# Patient Record
Sex: Female | Born: 1960 | Race: White | Hispanic: No | Marital: Married | Smoking: Current every day smoker
Health system: Southern US, Community
[De-identification: ages and names within clinical notes are randomized; demographics above are authoritative.]

## PROBLEM LIST (undated history)

## (undated) DIAGNOSIS — R51 Headache: Secondary | ICD-10-CM

## (undated) DIAGNOSIS — R519 Headache, unspecified: Secondary | ICD-10-CM

## (undated) DIAGNOSIS — G629 Polyneuropathy, unspecified: Secondary | ICD-10-CM

## (undated) DIAGNOSIS — E119 Type 2 diabetes mellitus without complications: Secondary | ICD-10-CM

## (undated) DIAGNOSIS — F329 Major depressive disorder, single episode, unspecified: Secondary | ICD-10-CM

## (undated) DIAGNOSIS — F419 Anxiety disorder, unspecified: Secondary | ICD-10-CM

## (undated) DIAGNOSIS — I10 Essential (primary) hypertension: Secondary | ICD-10-CM

## (undated) DIAGNOSIS — F32A Depression, unspecified: Secondary | ICD-10-CM

## (undated) HISTORY — PX: CHOLECYSTECTOMY: SHX55

## (undated) HISTORY — PX: BACK SURGERY: SHX140

## (undated) HISTORY — PX: ABDOMINAL HYSTERECTOMY: SHX81

## (undated) HISTORY — PX: NECK SURGERY: SHX720

## (undated) HISTORY — PX: BLADDER SURGERY: SHX569

---

## 2007-07-18 ENCOUNTER — Emergency Department: Payer: Self-pay | Admitting: Emergency Medicine

## 2008-09-25 ENCOUNTER — Encounter: Admission: RE | Admit: 2008-09-25 | Discharge: 2008-09-25 | Payer: Self-pay | Admitting: Neurosurgery

## 2008-10-01 ENCOUNTER — Observation Stay (HOSPITAL_COMMUNITY): Admission: AD | Admit: 2008-10-01 | Discharge: 2008-10-02 | Payer: Self-pay | Admitting: Neurosurgery

## 2009-01-20 ENCOUNTER — Inpatient Hospital Stay (HOSPITAL_COMMUNITY): Admission: RE | Admit: 2009-01-20 | Discharge: 2009-01-24 | Payer: Self-pay | Admitting: Neurosurgery

## 2009-04-14 ENCOUNTER — Emergency Department: Payer: Self-pay | Admitting: Emergency Medicine

## 2009-08-13 ENCOUNTER — Encounter: Admission: RE | Admit: 2009-08-13 | Discharge: 2009-08-13 | Payer: Self-pay | Admitting: Neurosurgery

## 2009-09-23 ENCOUNTER — Encounter: Admission: RE | Admit: 2009-09-23 | Discharge: 2009-09-23 | Payer: Self-pay | Admitting: Neurosurgery

## 2010-06-16 LAB — BASIC METABOLIC PANEL
CO2: 26 mEq/L (ref 19–32)
Calcium: 10.1 mg/dL (ref 8.4–10.5)
Creatinine, Ser: 0.96 mg/dL (ref 0.4–1.2)
GFR calc Af Amer: >60 mL/min (ref >60)
GFR calc non Af Amer: >60 mL/min (ref >60)
Glucose, Bld: 94 mg/dL (ref 70–99)
Potassium: 4.3 mEq/L (ref 3.5–5.1)
Sodium: 141 mEq/L (ref 135–145)

## 2010-06-16 LAB — CBC
Hemoglobin: 10.7 g/dL — ABNORMAL LOW (ref 12.0–15.0)
RDW: 13.5 % (ref 11.5–15.5)
WBC: 12 10*3/uL — ABNORMAL HIGH (ref 4.0–10.5)

## 2010-06-16 LAB — TYPE AND SCREEN
ABO/RH(D): B POS
Antibody Screen: NEGATIVE

## 2010-06-16 LAB — ABO/RH: ABO/RH(D): B POS

## 2010-06-20 LAB — CBC
Hemoglobin: 15.1 g/dL — ABNORMAL HIGH (ref 12.0–15.0)
MCV: 92.4 fL (ref 78.0–100.0)
Platelets: 335 10*3/uL (ref 150–400)
RBC: 4.75 MIL/uL (ref 3.87–5.11)

## 2010-06-20 LAB — BASIC METABOLIC PANEL
CO2: 25 mEq/L (ref 19–32)
Calcium: 10.1 mg/dL (ref 8.4–10.5)
GFR calc non Af Amer: >60 mL/min (ref >60)
Glucose, Bld: 106 mg/dL — ABNORMAL HIGH (ref 70–99)
Potassium: 4.1 mEq/L (ref 3.5–5.1)
Sodium: 141 mEq/L (ref 135–145)

## 2010-07-27 NOTE — H&P (Signed)
Sandra Stein, Sandra Stein                ACCOUNT NO.:  [id number]   MEDICAL RECORD NO.:  [id number]          PATIENT TYPE:  INP   LOCATION:  3008                         FACILITY:  [facility]   PHYSICIAN:  Hilda Lias, M.D.   DATE OF BIRTH:  1961-03-06   DATE OF ADMISSION:  10/01/2008  DATE OF DISCHARGE:                              HISTORY & PHYSICAL   Sandra Stein is a lady who was seen by me about 2 months ago in my office  complaining of neck pain radiating to the right upper extremity, which  had been going on for several months.  The patient has failed  conservative treatment.  She is also complaining of back pain radiates  into both legs.  Because of these findings, she decided pain was not any  better.  She wanted to try with decompression of the cervical area first  and later on take care of the lumbar area.   ALLERGIES:  She is allergic to IBUPROFEN and AXERT.   SOCIAL HISTORY:  The patient does not drink.  She smokes 4 cigarettes a  day.   FAMILY HISTORY:  Positive for high blood pressure and heart disease.   REVIEW OF SYSTEMS:  Positive anxiety.  The patient admits to back pain,  neck pain.   PHYSICAL EXAMINATION:  HEAD, EYES, EARS, NOSE, AND THROAT:  Normal.  NECK:  She has a decreased flexibility secondary to pain.  LUNGS:  There are some rhonchi bilaterally.  CARDIOVASCULAR:  Normal.  ABDOMEN:  Normal.  EXTREMITIES:  Normal pulses.  NEURO:  She has weakness of the right biceps.  Sensation is normal.  Reflex is 1+.  She has __________ flexibility of the lumbar spine.   X-rays showed that she has cervical stenosis and probably herniated disk  at L5-6.  She has some mild arthrosis at the level of 4-5 and 3-4.  A  lumbar spine x-ray showed degenerative disk disease at L5-S1.   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   RECOMMENDATIONS:  The patient will be admitted for surgery.  The  procedure will be anterior cervical  diskectomy at the L5-6.  The patient  knows about the risks of infection, CSF leak, worsening pain, need for  surgery, stroke.  She also is fully aware that this type of surgery will  not correct lumbar problems.           ______________________________  Hilda Lias, M.D.     EB/MEDQ  D:  10/01/2008  T:  10/02/2008  Job:  [id number]

## 2010-07-27 NOTE — Op Note (Signed)
Sandra Stein, Sandra Stein                ACCOUNT NO.:  [id number]   MEDICAL RECORD NO.:  [id number]          PATIENT TYPE:  INP   LOCATION:  3008                         FACILITY:  [facility]   PHYSICIAN:  Hilda Lias, M.D.   DATE OF BIRTH:  [birth year]   DATE OF PROCEDURE:  10/01/2008  DATE OF DISCHARGE:                               OPERATIVE REPORT   PREOPERATIVE DIAGNOSES:  C5-C6 herniated disk, spondylosis, right C6  radiculopathy, obesity.   POSTOPERATIVE DIAGNOSES:  C5-C6 herniated disk, spondylosis, right C6  radiculopathy, obesity.   PROCEDURE:  Anterior 5-6 diskectomy, decompression of spinal cord,  decompression of both C6 nerve roots, interbody fusion with  auto/allograft, plate microscope.   SURGEON:  Hilda Lias, MD   CLINICAL HISTORY:  [name] is a 50 year old female complaining of  neck pain that radiates to the right upper extremity.  She has failed  conservative treatment.  X-rays showed that she has a herniated disk and  also spondylosis at the level of 5-6 to the right.  The patient wants to  proceed with surgery.  The risks were explained to her.   PROCEDURE:  The patient was taken to the OR and after intubation the  left side of the neck was cleaned with DuraPrep.  The patient has a  short neck.  It was difficult to feel the trachea secondary to her  obesity.  Nevertheless, incision was made in the left side through the  skin and subcutaneous tissue down to the cervical area.  We were able to  dissect and the x-ray showed that indeed we were at the level of C4-C5.  From then, we __________ straight down and we brought the microscope  into the area.  We opened the anterior ligament at the level of 5-6 and  total gross diskectomy was achieved.  The posterior ligament was opened  and we started working in the midline to the right side.  With  __________ , we were able to work in the right side.  At the level of  C6, the nerve was quite narrow.  The foramen was  quite stenotic and  there was some 3-4 fragmental disk compromising in that area.  From then  on after we decompressed the C6 nerve root, we decompressed the one in  the left side.  The central canal at the end was opened.  Then the  endplate was drilled and allograft of 7 mm with autograft inside was  inserted, also BMX.  A plate using four screw was achieved.  We tried to  do the x-ray again secondary to big shoulder and short neck, we were  unable to see anything __________ shoulder we were able to see them, we  were at the level of 5-6.  Although we achieved good hemostasis, I left  a drain.  The wound was closed with Vicryl and Steri-Strips.           ______________________________  Hilda Lias, M.D.     EB/MEDQ  D:  10/01/2008  T:  10/02/2008  Job:  [id number]

## 2016-07-26 ENCOUNTER — Other Ambulatory Visit: Payer: Self-pay | Admitting: Neurological Surgery

## 2016-08-11 ENCOUNTER — Inpatient Hospital Stay (HOSPITAL_COMMUNITY)
Admission: RE | Admit: 2016-08-11 | Discharge: 2016-08-11 | Disposition: A | Discharge status: Home / Self Care | Payer: Self-pay | Source: Ambulatory Visit

## 2016-08-11 NOTE — Pre-Procedure Instructions (Signed)
Sandra Stein  08/11/2016     No Pharmacies Listed   Your procedure is scheduled on June 7.  Report to [facility] Admitting at 915 A.M.  Call this number if you have problems the morning of surgery:  [phone]   Remember:  Do not eat food or drink liquids after midnight.  Take these medicines the morning of surgery with A SIP OF WATER  Stop taking aspirin, BC's, Goody's, Herbal medications, Fish Oil, Ibuprofen, Advil, Motrin, Aleve, vitamins - 7 days before your surgery   Do not wear jewelry, make-up or nail polish.  Do not wear lotions, powders, or perfumes, or deoderant.  Do not shave 48 hours prior to surgery.  Men may shave face and neck.  Do not bring valuables to the hospital.  [facility] is not responsible for any belongings or valuables.  Contacts, dentures or bridgework may not be worn into surgery.  Leave your suitcase in the car.  After surgery it may be brought to your room.  For patients admitted to the hospital, discharge time will be determined by your treatment team.  Patients discharged the day of surgery will not be allowed to drive home.    Special instructions:  [place] - Preparing for Surgery  Before surgery, you can play an important role.  Because skin is not sterile, your skin needs to be as free of germs as possible.  You can reduce the number of germs on you skin by washing with CHG (chlorahexidine gluconate) soap before surgery.  CHG is an antiseptic cleaner which kills germs and bonds with the skin to continue killing germs even after washing.  Please DO NOT use if you have an allergy to CHG or antibacterial soaps.  If your skin becomes reddened/irritated stop using the CHG and inform your nurse when you arrive at Short Stay.  Do not shave (including legs and underarms) for at least 48 hours prior to the first CHG shower.  You may shave your face.  Please follow these instructions carefully:   1.  Shower with CHG Soap the  night before surgery and the                                morning of Surgery.  2.  If you choose to wash your hair, wash your hair first as usual with your       normal shampoo.  3.  After you shampoo, rinse your hair and body thoroughly to remove the                      Shampoo.  4.  Use CHG as you would any other liquid soap.  You can apply chg directly       to the skin and wash gently with scrungie or a clean washcloth.  5.  Apply the CHG Soap to your body ONLY FROM THE NECK DOWN.        Do not use on open wounds or open sores.  Avoid contact with your eyes,       ears, mouth and genitals (private parts).  Wash genitals (private parts)       with your normal soap.  6.  Wash thoroughly, paying special attention to the area where your surgery        will be performed.  7.  Thoroughly rinse your body with warm water from  the neck down.  8.  DO NOT shower/wash with your normal soap after using and rinsing off       the CHG Soap.  9.  Pat yourself dry with a clean towel.            10.  Wear clean pajamas.            11.  Place clean sheets on your bed the night of your first shower and do not        sleep with pets.  Day of Surgery  Do not apply any lotions/deoderants the morning of surgery.  Please wear clean clothes to the hospital/surgery center.     Please read over the following fact sheets that you were given. Pain Booklet, Coughing and Deep Breathing, MRSA Information and Surgical Site Infection Prevention

## 2016-08-11 NOTE — Progress Notes (Signed)
Not here for PAT appointment message left to return call.

## 2016-08-16 ENCOUNTER — Ambulatory Visit (HOSPITAL_COMMUNITY)
Admission: RE | Admit: 2016-08-16 | Discharge: 2016-08-16 | Disposition: A | Discharge status: Home / Self Care | Payer: Medicaid Other | Source: Ambulatory Visit | Attending: Neurological Surgery | Admitting: Neurological Surgery

## 2016-08-16 ENCOUNTER — Encounter (HOSPITAL_COMMUNITY): Payer: Self-pay

## 2016-08-16 ENCOUNTER — Encounter (HOSPITAL_COMMUNITY)
Admission: RE | Admit: 2016-08-16 | Discharge: 2016-08-16 | Disposition: A | Discharge status: Home / Self Care | Payer: Medicaid Other | Source: Ambulatory Visit | Attending: Neurological Surgery | Admitting: Neurological Surgery

## 2016-08-16 DIAGNOSIS — Z01818 Encounter for other preprocedural examination: Secondary | ICD-10-CM | POA: Diagnosis not present

## 2016-08-16 DIAGNOSIS — M542 Cervicalgia: Secondary | ICD-10-CM | POA: Diagnosis not present

## 2016-08-16 DIAGNOSIS — Z01810 Encounter for preprocedural cardiovascular examination: Secondary | ICD-10-CM | POA: Insufficient documentation

## 2016-08-16 HISTORY — DX: Major depressive disorder, single episode, unspecified: F32.9

## 2016-08-16 HISTORY — DX: Polyneuropathy, unspecified: G62.9

## 2016-08-16 HISTORY — DX: Headache, unspecified: R51.9

## 2016-08-16 HISTORY — DX: Depression, unspecified: F32.A

## 2016-08-16 HISTORY — DX: Anxiety disorder, unspecified: F41.9

## 2016-08-16 HISTORY — DX: Type 2 diabetes mellitus without complications: E11.9

## 2016-08-16 HISTORY — DX: Headache: R51

## 2016-08-16 HISTORY — DX: Essential (primary) hypertension: I10

## 2016-08-16 LAB — BASIC METABOLIC PANEL
ANION GAP: 10 (ref 5–15)
BUN: 15 mg/dL (ref 6–20)
CO2: 23 mmol/L (ref 22–32)
Calcium: 9.6 mg/dL (ref 8.9–10.3)
Chloride: 105 mmol/L (ref 101–111)
Creatinine, Ser: 0.91 mg/dL (ref 0.44–1.00)
GFR calc non Af Amer: >60 mL/min (ref >60)
Glucose, Bld: 69 mg/dL (ref 65–99)
POTASSIUM: 4 mmol/L (ref 3.5–5.1)
Sodium: 138 mmol/L (ref 135–145)

## 2016-08-16 LAB — PROTIME-INR
INR: 1.17
Prothrombin Time: 15 seconds (ref 11.4–15.2)

## 2016-08-16 LAB — GLUCOSE, CAPILLARY: GLUCOSE-CAPILLARY: 162 mg/dL — AB (ref 65–99)

## 2016-08-16 LAB — CBC WITH DIFFERENTIAL/PLATELET
BASOS ABS: 0 10*3/uL (ref 0.0–0.1)
BASOS PCT: 0 %
Eosinophils Absolute: 0.3 10*3/uL (ref 0.0–0.7)
Eosinophils Relative: 4 %
HEMATOCRIT: 41.5 % (ref 36.0–46.0)
HEMOGLOBIN: 13.5 g/dL (ref 12.0–15.0)
LYMPHS PCT: 34 %
Lymphs Abs: 2.7 10*3/uL (ref 0.7–4.0)
MCH: 30.1 pg (ref 26.0–34.0)
MCHC: 32.5 g/dL (ref 30.0–36.0)
MCV: 92.4 fL (ref 78.0–100.0)
MONO ABS: 0.4 10*3/uL (ref 0.1–1.0)
MONOS PCT: 5 %
NEUTROS ABS: 4.5 10*3/uL (ref 1.7–7.7)
NEUTROS PCT: 57 %
Platelets: 160 10*3/uL (ref 150–400)
RBC: 4.49 MIL/uL (ref 3.87–5.11)
RDW: 13.9 % (ref 11.5–15.5)
WBC: 7.8 10*3/uL (ref 4.0–10.5)

## 2016-08-16 LAB — SURGICAL PCR SCREEN
MRSA, PCR: NEGATIVE
Staphylococcus aureus: NEGATIVE

## 2016-08-16 NOTE — Progress Notes (Signed)
[facility]

## 2016-08-16 NOTE — Pre-Procedure Instructions (Signed)
Sandra Stein  08/16/2016      [facility] - [place], [place] - [address] [place] [address] Phone: [phone] Fax: [phone]    Your procedure is scheduled on Thursday August 18, 2016.  Report to [facility] Admitting at 915 A.M.  Call this number if you have problems the morning of surgery:  [phone]   Remember:  Do not eat food or drink liquids after midnight.  Take these medicines the morning of surgery with A SIP OF WATER: Albuterol inhaler (if needed), Citalopram (Celexa), Diazepam (Valium) - if needed,   Stop taking aspirin, BC's, Goody's, Herbal medications, Fish Oil, Ibuprofen, Advil, Motrin, Aleve, vitamins - 7 days before your surgery     How to Manage Your Diabetes Before and After Surgery  Why is it important to control my blood sugar before and after surgery? . Improving blood sugar levels before and after surgery helps healing and can limit problems. . A way of improving blood sugar control is eating a healthy diet by: o  Eating less sugar and carbohydrates o  Increasing activity/exercise o  Talking with your doctor about reaching your blood sugar goals . High blood sugars (greater than 180 mg/dL) can raise your risk of infections and slow your recovery, so you will need to focus on controlling your diabetes during the weeks before surgery. . Make sure that the doctor who takes care of your diabetes knows about your planned surgery including the date and location.  How do I manage my blood sugar before surgery? . Check your blood sugar at least 4 times a day, starting 2 days before surgery, to make sure that the level is not too high or low. o Check your blood sugar the morning of your surgery when you wake up and every 2 hours until you get to the Short Stay unit. . If your blood sugar is less than 70 mg/dL, you will need to treat for low blood sugar: o Do not take insulin. o Treat a low blood sugar  (less than 70 mg/dL) with  cup of clear juice (cranberry or apple), 4 glucose tablets, OR glucose gel. o Recheck blood sugar in 15 minutes after treatment (to make sure it is greater than 70 mg/dL). If your blood sugar is not greater than 70 mg/dL on recheck, call [phone] for further instructions. . Report your blood sugar to the short stay nurse when you get to Short Stay.  . If you are admitted to the hospital after surgery: o Your blood sugar will be checked by the staff and you will probably be given insulin after surgery (instead of oral diabetes medicines) to make sure you have good blood sugar levels. o The goal for blood sugar control after surgery is 80-180 mg/dL.       WHAT DO I DO ABOUT MY DIABETES MEDICATION?   [facility] Kitchen Do not take oral diabetes medicines (pills) the morning of surgery.  . THE NIGHT BEFORE SURGERY, take 28-31 units of Novolog 70/30 insulin.       . THE MORNING OF SURGERY, take 0 units of insulin.  . If your CBG is greater than 220 mg/dL, you may take  of your sliding scale (correction) dose of insulin.  Other Instructions:          Do not wear jewelry, make-up or nail polish.  Do not wear lotions, powders, or perfumes, or deoderant.  Do not  shave 48 hours prior to surgery.  Men may shave face and neck.  Do not bring valuables to the hospital.  [facility] is not responsible for any belongings or valuables.  Contacts, dentures or bridgework may not be worn into surgery.  Leave your suitcase in the car.  After surgery it may be brought to your room.  For patients admitted to the hospital, discharge time will be determined by your treatment team.  Patients discharged the day of surgery will not be allowed to drive home.    Special instructions:    [facility]- Preparing For Surgery  Before surgery, you can play an important role. Because skin is not sterile, your skin needs to be as free of germs as possible. You can reduce the number of germs  on your skin by washing with CHG (chlorahexidine gluconate) Soap before surgery.  CHG is an antiseptic cleaner which kills germs and bonds with the skin to continue killing germs even after washing.  Please do not use if you have an allergy to CHG or antibacterial soaps. If your skin becomes reddened/irritated stop using the CHG.  Do not shave (including legs and underarms) for at least 48 hours prior to first CHG shower. It is OK to shave your face.  Please follow these instructions carefully.   1. Shower the NIGHT BEFORE SURGERY and the MORNING OF SURGERY with CHG.   2. If you chose to wash your hair, wash your hair first as usual with your normal shampoo.  3. After you shampoo, rinse your hair and body thoroughly to remove the shampoo.  4. Use CHG as you would any other liquid soap. You can apply CHG directly to the skin and wash gently with a scrungie or a clean washcloth.   5. Apply the CHG Soap to your body ONLY FROM THE NECK DOWN.  Do not use on open wounds or open sores. Avoid contact with your eyes, ears, mouth and genitals (private parts). Wash genitals (private parts) with your normal soap.  6. Wash thoroughly, paying special attention to the area where your surgery will be performed.  7. Thoroughly rinse your body with warm water from the neck down.  8. DO NOT shower/wash with your normal soap after using and rinsing off the CHG Soap.  9. Pat yourself dry with a CLEAN TOWEL.   10. Wear CLEAN PAJAMAS   11. Place CLEAN SHEETS on your bed the night of your first shower and DO NOT SLEEP WITH PETS.    Day of Surgery: Do not apply any deodorants/lotions. Please wear clean clothes to the hospital/surgery center.        Please read over the following fact sheets that you were given. Pain Booklet, Coughing and Deep Breathing, MRSA Information and Surgical Site Infection Prevention

## 2016-08-17 LAB — HEMOGLOBIN A1C
Hgb A1c MFr Bld: 6.3 % — ABNORMAL HIGH (ref 4.8–5.6)
Mean Plasma Glucose: 134 mg/dL

## 2016-08-17 MED ORDER — DEXTROSE 5 % IV SOLN
3.0000 g | INTRAVENOUS | Status: DC
  Filled 2016-08-17: qty 3000

## 2016-08-17 MED ORDER — DEXAMETHASONE SODIUM PHOSPHATE 10 MG/ML IJ SOLN
10.0000 mg | INTRAMUSCULAR | Status: AC
  Administered 2016-08-18: 10 mg via INTRAVENOUS
  Filled 2016-08-17: qty 1

## 2016-08-18 ENCOUNTER — Ambulatory Visit (HOSPITAL_COMMUNITY): Payer: Medicaid Other | Admitting: Certified Registered Nurse Anesthetist

## 2016-08-18 ENCOUNTER — Ambulatory Visit (HOSPITAL_COMMUNITY): Payer: Medicaid Other

## 2016-08-18 ENCOUNTER — Observation Stay (HOSPITAL_COMMUNITY)
Admission: RE | Admit: 2016-08-18 | Discharge: 2016-08-19 | Disposition: A | Discharge status: Home / Self Care | Payer: Medicaid Other | Source: Ambulatory Visit | Attending: Neurological Surgery | Admitting: Neurological Surgery

## 2016-08-18 ENCOUNTER — Encounter (HOSPITAL_COMMUNITY): Payer: Self-pay | Admitting: *Deleted

## 2016-08-18 ENCOUNTER — Encounter (HOSPITAL_COMMUNITY)
Admission: RE | Disposition: A | Discharge status: Home / Self Care | Payer: Self-pay | Source: Ambulatory Visit | Attending: Neurological Surgery

## 2016-08-18 DIAGNOSIS — M5021 Other cervical disc displacement,  high cervical region: Secondary | ICD-10-CM | POA: Insufficient documentation

## 2016-08-18 DIAGNOSIS — F329 Major depressive disorder, single episode, unspecified: Secondary | ICD-10-CM | POA: Insufficient documentation

## 2016-08-18 DIAGNOSIS — M4802 Spinal stenosis, cervical region: Secondary | ICD-10-CM | POA: Diagnosis not present

## 2016-08-18 DIAGNOSIS — I10 Essential (primary) hypertension: Secondary | ICD-10-CM | POA: Insufficient documentation

## 2016-08-18 DIAGNOSIS — F419 Anxiety disorder, unspecified: Secondary | ICD-10-CM | POA: Diagnosis not present

## 2016-08-18 DIAGNOSIS — Z794 Long term (current) use of insulin: Secondary | ICD-10-CM | POA: Insufficient documentation

## 2016-08-18 DIAGNOSIS — Z419 Encounter for procedure for purposes other than remedying health state, unspecified: Secondary | ICD-10-CM

## 2016-08-18 DIAGNOSIS — E119 Type 2 diabetes mellitus without complications: Secondary | ICD-10-CM | POA: Insufficient documentation

## 2016-08-18 DIAGNOSIS — F17210 Nicotine dependence, cigarettes, uncomplicated: Secondary | ICD-10-CM | POA: Diagnosis not present

## 2016-08-18 DIAGNOSIS — M47812 Spondylosis without myelopathy or radiculopathy, cervical region: Secondary | ICD-10-CM | POA: Diagnosis not present

## 2016-08-18 DIAGNOSIS — Z981 Arthrodesis status: Secondary | ICD-10-CM | POA: Diagnosis not present

## 2016-08-18 DIAGNOSIS — Z79899 Other long term (current) drug therapy: Secondary | ICD-10-CM | POA: Insufficient documentation

## 2016-08-18 DIAGNOSIS — Z7984 Long term (current) use of oral hypoglycemic drugs: Secondary | ICD-10-CM | POA: Insufficient documentation

## 2016-08-18 DIAGNOSIS — M542 Cervicalgia: Secondary | ICD-10-CM | POA: Diagnosis present

## 2016-08-18 DIAGNOSIS — M4322 Fusion of spine, cervical region: Secondary | ICD-10-CM | POA: Diagnosis present

## 2016-08-18 HISTORY — PX: ANTERIOR CERVICAL DECOMP/DISCECTOMY FUSION: SHX1161

## 2016-08-18 LAB — GLUCOSE, CAPILLARY
GLUCOSE-CAPILLARY: 150 mg/dL — AB (ref 65–99)
GLUCOSE-CAPILLARY: 272 mg/dL — AB (ref 65–99)
GLUCOSE-CAPILLARY: 241 mg/dL — AB (ref 65–99)
Glucose-Capillary: 135 mg/dL — ABNORMAL HIGH (ref 65–99)
Glucose-Capillary: 96 mg/dL (ref 65–99)

## 2016-08-18 SURGERY — ANTERIOR CERVICAL DECOMPRESSION/DISCECTOMY FUSION 2 LEVEL/HARDWARE REMOVAL
Anesthesia: General | Laterality: Left

## 2016-08-18 MED ORDER — ACETAMINOPHEN 325 MG PO TABS
650.0000 mg | ORAL_TABLET | ORAL | Status: DC | PRN
  Administered 2016-08-19: 650 mg via ORAL
  Filled 2016-08-18: qty 2

## 2016-08-18 MED ORDER — LACTATED RINGERS IV SOLN
INTRAVENOUS | Status: DC
  Administered 2016-08-18 (×3): via INTRAVENOUS

## 2016-08-18 MED ORDER — NORTRIPTYLINE HCL 25 MG PO CAPS
50.0000 mg | ORAL_CAPSULE | Freq: Every day | ORAL | Status: DC
  Administered 2016-08-18: 50 mg via ORAL
  Filled 2016-08-18: qty 2

## 2016-08-18 MED ORDER — FUROSEMIDE 20 MG PO TABS
20.0000 mg | ORAL_TABLET | Freq: Every day | ORAL | Status: DC
  Administered 2016-08-19: 20 mg via ORAL
  Filled 2016-08-18: qty 1

## 2016-08-18 MED ORDER — LIDOCAINE 2% (20 MG/ML) 5 ML SYRINGE
INTRAMUSCULAR | Status: DC | PRN
  Administered 2016-08-18: 100 mg via INTRAVENOUS

## 2016-08-18 MED ORDER — MIDAZOLAM HCL 2 MG/2ML IJ SOLN
INTRAMUSCULAR | Status: DC | PRN
  Administered 2016-08-18: 2 mg via INTRAVENOUS

## 2016-08-18 MED ORDER — LIDOCAINE 2% (20 MG/ML) 5 ML SYRINGE
INTRAMUSCULAR | Status: AC
  Filled 2016-08-18: qty 5

## 2016-08-18 MED ORDER — METHOCARBAMOL 500 MG PO TABS
500.0000 mg | ORAL_TABLET | Freq: Four times a day (QID) | ORAL | Status: DC | PRN
  Administered 2016-08-18 – 2016-08-19 (×3): 500 mg via ORAL
  Filled 2016-08-18 (×2): qty 1

## 2016-08-18 MED ORDER — QUETIAPINE FUMARATE 400 MG PO TABS
400.0000 mg | ORAL_TABLET | Freq: Every evening | ORAL | Status: DC | PRN
  Administered 2016-08-18: 400 mg via ORAL
  Filled 2016-08-18 (×2): qty 1

## 2016-08-18 MED ORDER — PHENYLEPHRINE HCL 10 MG/ML IJ SOLN
INTRAVENOUS | Status: DC | PRN
  Administered 2016-08-18: 40 ug/min via INTRAVENOUS
  Administered 2016-08-18: 30 ug/min via INTRAVENOUS

## 2016-08-18 MED ORDER — FENTANYL CITRATE (PF) 100 MCG/2ML IJ SOLN
25.0000 ug | INTRAMUSCULAR | Status: DC | PRN
  Administered 2016-08-18 (×2): 50 ug via INTRAVENOUS

## 2016-08-18 MED ORDER — ONDANSETRON HCL 4 MG/2ML IJ SOLN
INTRAMUSCULAR | Status: AC
  Filled 2016-08-18: qty 2

## 2016-08-18 MED ORDER — SUGAMMADEX SODIUM 200 MG/2ML IV SOLN
INTRAVENOUS | Status: DC | PRN
  Administered 2016-08-18: 200 mg via INTRAVENOUS

## 2016-08-18 MED ORDER — DEXAMETHASONE SODIUM PHOSPHATE 10 MG/ML IJ SOLN
INTRAMUSCULAR | Status: AC
  Filled 2016-08-18: qty 1

## 2016-08-18 MED ORDER — SENNA 8.6 MG PO TABS
1.0000 | ORAL_TABLET | Freq: Two times a day (BID) | ORAL | Status: DC
  Administered 2016-08-18 – 2016-08-19 (×2): 8.6 mg via ORAL
  Filled 2016-08-18 (×2): qty 1

## 2016-08-18 MED ORDER — NORTRIPTYLINE HCL 25 MG PO CAPS
25.0000 mg | ORAL_CAPSULE | Freq: Every morning | ORAL | Status: DC
  Administered 2016-08-19: 25 mg via ORAL
  Filled 2016-08-18: qty 1

## 2016-08-18 MED ORDER — CEFAZOLIN SODIUM-DEXTROSE 2-4 GM/100ML-% IV SOLN
2.0000 g | Freq: Three times a day (TID) | INTRAVENOUS | Status: AC
  Administered 2016-08-18 – 2016-08-19 (×2): 2 g via INTRAVENOUS
  Filled 2016-08-18 (×2): qty 100

## 2016-08-18 MED ORDER — SUGAMMADEX SODIUM 200 MG/2ML IV SOLN
INTRAVENOUS | Status: AC
  Filled 2016-08-18: qty 2

## 2016-08-18 MED ORDER — MIDAZOLAM HCL 2 MG/2ML IJ SOLN
INTRAMUSCULAR | Status: AC
  Filled 2016-08-18: qty 2

## 2016-08-18 MED ORDER — FENTANYL CITRATE (PF) 250 MCG/5ML IJ SOLN
INTRAMUSCULAR | Status: AC
  Filled 2016-08-18: qty 5

## 2016-08-18 MED ORDER — SODIUM CHLORIDE 0.9% FLUSH
3.0000 mL | Freq: Two times a day (BID) | INTRAVENOUS | Status: DC
  Administered 2016-08-18 (×2): 3 mL via INTRAVENOUS

## 2016-08-18 MED ORDER — HEMOSTATIC AGENTS (NO CHARGE) OPTIME
TOPICAL | Status: DC | PRN
  Administered 2016-08-18: 1 via TOPICAL

## 2016-08-18 MED ORDER — CHLORHEXIDINE GLUCONATE CLOTH 2 % EX PADS: 6.0000 | MEDICATED_PAD | Freq: Once | CUTANEOUS | Status: DC

## 2016-08-18 MED ORDER — MORPHINE SULFATE (PF) 4 MG/ML IV SOLN
2.0000 mg | INTRAVENOUS | Status: DC | PRN
  Administered 2016-08-18: 2 mg via INTRAVENOUS
  Filled 2016-08-18: qty 1

## 2016-08-18 MED ORDER — THROMBIN 5000 UNITS EX SOLR
OROMUCOSAL | Status: DC | PRN
  Administered 2016-08-18: 11:00:00 via TOPICAL

## 2016-08-18 MED ORDER — THROMBIN 5000 UNITS EX SOLR
CUTANEOUS | Status: AC
  Filled 2016-08-18: qty 15000

## 2016-08-18 MED ORDER — ACETAMINOPHEN 650 MG RE SUPP: 650.0000 mg | RECTAL | Status: DC | PRN

## 2016-08-18 MED ORDER — ONDANSETRON HCL 4 MG/2ML IJ SOLN
4.0000 mg | Freq: Four times a day (QID) | INTRAMUSCULAR | Status: DC | PRN
  Administered 2016-08-18: 4 mg via INTRAVENOUS
  Filled 2016-08-18: qty 2

## 2016-08-18 MED ORDER — FENTANYL CITRATE (PF) 100 MCG/2ML IJ SOLN
INTRAMUSCULAR | Status: AC
  Administered 2016-08-18: 50 ug via INTRAVENOUS
  Filled 2016-08-18: qty 2

## 2016-08-18 MED ORDER — OXYCODONE HCL 5 MG PO TABS
ORAL_TABLET | ORAL | Status: AC
  Filled 2016-08-18: qty 2

## 2016-08-18 MED ORDER — SODIUM CHLORIDE 0.9% FLUSH: 3.0000 mL | INTRAVENOUS | Status: DC | PRN

## 2016-08-18 MED ORDER — METHOCARBAMOL 500 MG PO TABS
ORAL_TABLET | ORAL | Status: AC
  Filled 2016-08-18: qty 1

## 2016-08-18 MED ORDER — METFORMIN HCL 500 MG PO TABS
1000.0000 mg | ORAL_TABLET | Freq: Two times a day (BID) | ORAL | Status: DC
  Administered 2016-08-18 – 2016-08-19 (×2): 1000 mg via ORAL
  Filled 2016-08-18 (×2): qty 2

## 2016-08-18 MED ORDER — BUPIVACAINE HCL (PF) 0.25 % IJ SOLN
INTRAMUSCULAR | Status: AC
  Filled 2016-08-18: qty 30

## 2016-08-18 MED ORDER — ALBUTEROL SULFATE (2.5 MG/3ML) 0.083% IN NEBU: 2.5000 mg | INHALATION_SOLUTION | Freq: Four times a day (QID) | RESPIRATORY_TRACT | Status: DC | PRN

## 2016-08-18 MED ORDER — PROPOFOL 10 MG/ML IV BOLUS
INTRAVENOUS | Status: DC | PRN
  Administered 2016-08-18: 150 mg via INTRAVENOUS

## 2016-08-18 MED ORDER — CEFAZOLIN SODIUM-DEXTROSE 2-3 GM-% IV SOLR
INTRAVENOUS | Status: DC | PRN
  Administered 2016-08-18: 3 g via INTRAVENOUS

## 2016-08-18 MED ORDER — ONDANSETRON HCL 4 MG/2ML IJ SOLN
INTRAMUSCULAR | Status: DC | PRN
Start: 2016-08-18 — End: 2016-08-18
  Administered 2016-08-18: 4 mg via INTRAVENOUS

## 2016-08-18 MED ORDER — THROMBIN 5000 UNITS EX SOLR
CUTANEOUS | Status: DC | PRN
  Administered 2016-08-18: 5000 [IU] via TOPICAL

## 2016-08-18 MED ORDER — DEXTROSE 5 % IV SOLN
500.0000 mg | Freq: Four times a day (QID) | INTRAVENOUS | Status: DC | PRN
  Filled 2016-08-18: qty 5

## 2016-08-18 MED ORDER — LISINOPRIL 20 MG PO TABS
10.0000 mg | ORAL_TABLET | Freq: Every day | ORAL | Status: DC
  Administered 2016-08-18 – 2016-08-19 (×2): 10 mg via ORAL
  Filled 2016-08-18 (×2): qty 1

## 2016-08-18 MED ORDER — LACTATED RINGERS IV SOLN: INTRAVENOUS | Status: DC

## 2016-08-18 MED ORDER — ROCURONIUM BROMIDE 10 MG/ML (PF) SYRINGE
PREFILLED_SYRINGE | INTRAVENOUS | Status: AC
  Filled 2016-08-18: qty 5

## 2016-08-18 MED ORDER — LIDOCAINE HCL 4 % MT SOLN
OROMUCOSAL | Status: DC | PRN
  Administered 2016-08-18: 3 mL via TOPICAL

## 2016-08-18 MED ORDER — POTASSIUM CHLORIDE IN NACL 20-0.9 MEQ/L-% IV SOLN: INTRAVENOUS | Status: DC

## 2016-08-18 MED ORDER — PROPOFOL 10 MG/ML IV BOLUS
INTRAVENOUS | Status: AC
  Filled 2016-08-18: qty 20

## 2016-08-18 MED ORDER — INSULIN ASPART 100 UNIT/ML ~~LOC~~ SOLN
0.0000 [IU] | Freq: Three times a day (TID) | SUBCUTANEOUS | Status: DC
  Administered 2016-08-18: 8 [IU] via SUBCUTANEOUS

## 2016-08-18 MED ORDER — INSULIN ASPART 100 UNIT/ML ~~LOC~~ SOLN
0.0000 [IU] | Freq: Three times a day (TID) | SUBCUTANEOUS | Status: DC
  Administered 2016-08-19: 4 [IU] via SUBCUTANEOUS

## 2016-08-18 MED ORDER — FENTANYL CITRATE (PF) 250 MCG/5ML IJ SOLN
INTRAMUSCULAR | Status: DC | PRN
  Administered 2016-08-18 (×2): 100 ug via INTRAVENOUS
  Administered 2016-08-18 (×2): 50 ug via INTRAVENOUS

## 2016-08-18 MED ORDER — ONDANSETRON HCL 4 MG PO TABS: 4.0000 mg | ORAL_TABLET | Freq: Four times a day (QID) | ORAL | Status: DC | PRN

## 2016-08-18 MED ORDER — OXYCODONE HCL 5 MG PO TABS
5.0000 mg | ORAL_TABLET | ORAL | Status: DC | PRN
  Administered 2016-08-18 – 2016-08-19 (×6): 10 mg via ORAL
  Filled 2016-08-18 (×5): qty 2

## 2016-08-18 MED ORDER — INSULIN ASPART 100 UNIT/ML ~~LOC~~ SOLN
0.0000 [IU] | Freq: Every day | SUBCUTANEOUS | Status: DC
  Administered 2016-08-18: 2 [IU] via SUBCUTANEOUS

## 2016-08-18 MED ORDER — ROCURONIUM BROMIDE 10 MG/ML (PF) SYRINGE
PREFILLED_SYRINGE | INTRAVENOUS | Status: DC | PRN
  Administered 2016-08-18 (×2): 50 mg via INTRAVENOUS

## 2016-08-18 MED ORDER — PHENYLEPHRINE 40 MCG/ML (10ML) SYRINGE FOR IV PUSH (FOR BLOOD PRESSURE SUPPORT)
PREFILLED_SYRINGE | INTRAVENOUS | Status: AC
  Filled 2016-08-18: qty 10

## 2016-08-18 MED ORDER — CITALOPRAM HYDROBROMIDE 40 MG PO TABS
40.0000 mg | ORAL_TABLET | Freq: Every day | ORAL | Status: DC
  Administered 2016-08-19: 40 mg via ORAL
  Filled 2016-08-18: qty 1

## 2016-08-18 MED ORDER — 0.9 % SODIUM CHLORIDE (POUR BTL) OPTIME
TOPICAL | Status: DC | PRN
  Administered 2016-08-18: 1000 mL

## 2016-08-18 SURGICAL SUPPLY — 62 items
BAG DECANTER FOR FLEXI CONT (MISCELLANEOUS) ×3 IMPLANT
BASKET BONE COLLECTION (BASKET) ×3 IMPLANT
BENZOIN TINCTURE PRP APPL 2/3 (GAUZE/BANDAGES/DRESSINGS) ×3 IMPLANT
BIT DRILL (BIT) ×2
BIT DRILL 11MM (BIT) ×1 IMPLANT
BUR MATCHSTICK NEURO 3.0 LAGG (BURR) ×3 IMPLANT
CAGE CERVICAL 8 (Cage) ×2 IMPLANT
CAGE CERVICAL 8MM (Cage) ×2 IMPLANT
CANISTER SUCT 3000ML PPV (MISCELLANEOUS) ×3 IMPLANT
CARTRIDGE OIL MAESTRO DRILL (MISCELLANEOUS) ×1 IMPLANT
CLOSURE WOUND 1/2 X4 (GAUZE/BANDAGES/DRESSINGS) ×1
DERMABOND ADVANCED (GAUZE/BANDAGES/DRESSINGS) ×2
DERMABOND ADVANCED .7 DNX12 (GAUZE/BANDAGES/DRESSINGS) ×1 IMPLANT
DIFFUSER DRILL AIR PNEUMATIC (MISCELLANEOUS) ×3 IMPLANT
DRAPE C-ARM 42X72 X-RAY (DRAPES) ×6 IMPLANT
DRAPE LAPAROTOMY 100X72 PEDS (DRAPES) ×3 IMPLANT
DRAPE MICROSCOPE LEICA (MISCELLANEOUS) ×3 IMPLANT
DRAPE POUCH INSTRU U-SHP 10X18 (DRAPES) ×3 IMPLANT
DRSG OPSITE POSTOP 3X4 (GAUZE/BANDAGES/DRESSINGS) ×3 IMPLANT
DURAPREP 6ML APPLICATOR 50/CS (WOUND CARE) ×3 IMPLANT
ELECT COATED BLADE 2.86 ST (ELECTRODE) ×3 IMPLANT
ELECT REM PT RETURN 9FT ADLT (ELECTROSURGICAL) ×3
ELECTRODE REM PT RTRN 9FT ADLT (ELECTROSURGICAL) ×1 IMPLANT
GAUZE SPONGE 4X4 16PLY XRAY LF (GAUZE/BANDAGES/DRESSINGS) IMPLANT
GLOVE BIO SURGEON STRL SZ7 (GLOVE) IMPLANT
GLOVE BIO SURGEON STRL SZ8 (GLOVE) ×3 IMPLANT
GLOVE BIOGEL M 7.0 STRL (GLOVE) ×3 IMPLANT
GLOVE BIOGEL PI IND STRL 7.0 (GLOVE) IMPLANT
GLOVE BIOGEL PI INDICATOR 7.0 (GLOVE)
GLOVE INDICATOR 7.0 STRL GRN (GLOVE) ×9 IMPLANT
GLOVE SURG SS PI 7.0 STRL IVOR (GLOVE) ×12 IMPLANT
GOWN STRL REUS W/ TWL LRG LVL3 (GOWN DISPOSABLE) ×2 IMPLANT
GOWN STRL REUS W/ TWL XL LVL3 (GOWN DISPOSABLE) ×2 IMPLANT
GOWN STRL REUS W/TWL 2XL LVL3 (GOWN DISPOSABLE) ×3 IMPLANT
GOWN STRL REUS W/TWL LRG LVL3 (GOWN DISPOSABLE) ×4
GOWN STRL REUS W/TWL XL LVL3 (GOWN DISPOSABLE) ×4
HALTER HD/CHIN CERV TRACTION D (MISCELLANEOUS) IMPLANT
HEMOSTAT POWDER KIT SURGIFOAM (HEMOSTASIS) ×3 IMPLANT
KIT BASIN OR (CUSTOM PROCEDURE TRAY) ×3 IMPLANT
KIT ROOM TURNOVER OR (KITS) ×3 IMPLANT
NEEDLE HYPO 25X1 1.5 SAFETY (NEEDLE) ×3 IMPLANT
NEEDLE SPNL 20GX3.5 QUINCKE YW (NEEDLE) ×3 IMPLANT
NS IRRIG 1000ML POUR BTL (IV SOLUTION) ×3 IMPLANT
OIL CARTRIDGE MAESTRO DRILL (MISCELLANEOUS) ×3
PACK LAMINECTOMY NEURO (CUSTOM PROCEDURE TRAY) ×3 IMPLANT
PAD ARMBOARD 7.5X6 YLW CONV (MISCELLANEOUS) ×3 IMPLANT
PIN DISTRACTION 14MM (PIN) ×6 IMPLANT
PLATE ACP 45XINTGR LCK MECH SL (Plate) ×1 IMPLANT
PLATE ACP VENTURE (Plate) ×2 IMPLANT
PUTTY DBX 1CC (Putty) ×3 IMPLANT
PUTTY DBX 1CC DEPUY (Putty) ×1 IMPLANT
RUBBERBAND STERILE (MISCELLANEOUS) ×6 IMPLANT
SCREW FA 4.0X13MM (Screw) ×15 IMPLANT
SCREW FA 4.5X13MM (Screw) ×3 IMPLANT
SPACER SPNL 7D LRG 16X14X8XNS (Cage) ×2 IMPLANT
SPCR SPNL 7D LRG 16X14X8XNS (Cage) ×2 IMPLANT
SPONGE INTESTINAL PEANUT (DISPOSABLE) ×3 IMPLANT
STRIP CLOSURE SKIN 1/2X4 (GAUZE/BANDAGES/DRESSINGS) ×2 IMPLANT
SUT VIC AB 3-0 SH 8-18 (SUTURE) ×6 IMPLANT
TOWEL GREEN STERILE (TOWEL DISPOSABLE) ×3 IMPLANT
TOWEL GREEN STERILE FF (TOWEL DISPOSABLE) ×3 IMPLANT
WATER STERILE IRR 1000ML POUR (IV SOLUTION) ×3 IMPLANT

## 2016-08-18 NOTE — Anesthesia Procedure Notes (Signed)
Procedure Name: Intubation Date/Time: 08/18/2016 11:21 AM Performed by: Mervyn Gay Pre-anesthesia Checklist: Patient identified, Patient being monitored, Timeout performed, Emergency Drugs available and Suction available Patient Re-evaluated:Patient Re-evaluated prior to inductionOxygen Delivery Method: Circle System Utilized Preoxygenation: Pre-oxygenation with 100% oxygen Intubation Type: IV induction Ventilation: Mask ventilation without difficulty Laryngoscope Size: Miller and 3 Grade View: Grade I Tube type: Oral Tube size: 7.5 mm Number of attempts: 1 Airway Equipment and Method: Stylet and LTA kit utilized Placement Confirmation: ETT inserted through vocal cords under direct vision,  positive ETCO2 and breath sounds checked- equal and bilateral Secured at: 21 cm Tube secured with: Tape Dental Injury: Teeth and Oropharynx as per pre-operative assessment  Comments: Head in neutral position during DL and intubation.

## 2016-08-18 NOTE — Anesthesia Preprocedure Evaluation (Addendum)
Anesthesia Evaluation  Patient identified by MRN, date of birth, ID band Patient awake    Reviewed: Allergy & Precautions, NPO status , Patient's Chart, lab work & pertinent test results  Airway Mallampati: II  TM Distance: >3 FB     Dental   Pulmonary Current Smoker,    breath sounds clear to auscultation       Cardiovascular hypertension,  Rhythm:Regular Rate:Normal     Neuro/Psych  Headaches,    GI/Hepatic negative GI ROS, Neg liver ROS,   Endo/Other  diabetes  Renal/GU negative Renal ROS     Musculoskeletal   Abdominal   Peds  Hematology   Anesthesia Other Findings   Reproductive/Obstetrics                             Anesthesia Physical Anesthesia Plan  ASA: III  Anesthesia Plan: General   Post-op Pain Management:    Induction: Intravenous  PONV Risk Score and Plan: Ondansetron and Propofol  Airway Management Planned:   Additional Equipment:   Intra-op Plan:   Post-operative Plan: Possible Post-op intubation/ventilation  Informed Consent: I have reviewed the patients History and Physical, chart, labs and discussed the procedure including the risks, benefits and alternatives for the proposed anesthesia with the patient or authorized representative who has indicated his/her understanding and acceptance.     Plan Discussed with: CRNA and Anesthesiologist  Anesthesia Plan Comments:         Anesthesia Quick Evaluation

## 2016-08-18 NOTE — H&P (Signed)
Subjective:   Patient is a 56 y.o. female admitted for neck pain. The patient first presented to me with complaints of neck pain. Onset of symptoms was several months ago. The pain is described as aching and occurs all day. The pain is rated severe, and is located in the neck and radiates to the shoulders. The symptoms have been progressive. Symptoms are exacerbated by extending head backwards, and are relieved by none and rest.  Previous work up includes MRI of cervical spine, results: spinal stenosis.  Past Medical History:  Diagnosis Date  . Anxiety   . Depression   . Diabetes mellitus without complication (HCC)   . Headache    migraines  . Hypertension   . Neuropathy     Past Surgical History:  Procedure Laterality Date  . ABDOMINAL HYSTERECTOMY    . BACK SURGERY     lower back  . BLADDER SURGERY    . CHOLECYSTECTOMY    . NECK SURGERY      Allergies  Allergen Reactions  . Axert [Almotriptan] Nausea And Vomiting and Other (See Comments)    Chest hurt  . Red Dye Hives and Itching    Social History  Substance Use Topics  . Smoking status: Current Every Day Smoker    Packs/day: 0.50    Years: 22.00    Types: Cigarettes  . Smokeless tobacco: Never Used  . Alcohol use No    Family History  Problem Relation Age of Onset  . Hypertension Mother   . Stroke Father   . Coronary artery disease Father    Prior to Admission medications   Medication Sig Start Date End Date Taking? Authorizing Provider  albuterol (PROVENTIL HFA;VENTOLIN HFA) 108 (90 Base) MCG/ACT inhaler Inhale 1-2 puffs into the lungs every 6 (six) hours as needed for wheezing or shortness of breath.   Yes [provider]  citalopram (CELEXA) 40 MG tablet Take 40 mg by mouth daily.   Yes [provider]  diazepam (VALIUM) 10 MG tablet Take 10 mg by mouth 3 (three) times daily as needed for anxiety.   Yes [provider]  furosemide (LASIX) 20 MG tablet Take 20 mg by mouth daily.   Yes  [provider]  insulin aspart protamine- aspart (NOVOLOG MIX 70/30) (70-30) 100 UNIT/ML injection Inject 40-45 Units into the skin 2 (two) times daily. Per BS   Yes [provider]  lisinopril (PRINIVIL,ZESTRIL) 10 MG tablet Take 10 mg by mouth daily.   Yes [provider]  metFORMIN (GLUCOPHAGE) 1000 MG tablet Take 1,000 mg by mouth 2 (two) times daily with a meal.   Yes [provider]  nortriptyline (PAMELOR) 25 MG capsule Take 25-50 mg by mouth See admin instructions. Take 25 mg by mouth in the morning and take 50 mg by mouth at bedtime   Yes [provider]  QUEtiapine (SEROQUEL) 400 MG tablet Take 400 mg by mouth at bedtime as needed (for sleep).   Yes [provider]     Review of Systems  Positive ROS: neg  All other systems have been reviewed and were otherwise negative with the exception of those mentioned in the HPI and as above.  Objective: Vital signs in last 24 hours: Temp:  [97.9 F (36.6 C)] 97.9 F (36.6 C) (06/07 0842) Pulse Rate:  [88] 88 (06/07 0842) Resp:  [20] 20 (06/07 0842) BP: (103)/(52) 103/52 (06/07 0842) SpO2:  [97 %] 97 % (06/07 0842) Weight:  [122 kg (269 lb)]  122 kg (269 lb) (06/07 0857)  General Appearance: Alert, cooperative, no distress, appears stated age Head: Normocephalic, without obvious abnormality, atraumatic Eyes: PERRL, conjunctiva/corneas clear, EOM's intact      Neck: Supple, symmetrical, trachea midline, Back: Symmetric, no curvature, ROM normal, no CVA tenderness Lungs:  respirations unlabored Heart: Regular rate and rhythm Abdomen: Soft, non-tender Extremities: Extremities normal, atraumatic, no cyanosis or edema Pulses: 2+ and symmetric all extremities Skin: Skin color, texture, turgor normal, no rashes or lesions  NEUROLOGIC:  Mental status: Alert and oriented x4, no aphasia, good attention span, fund of knowledge and memory  Motor Exam - grossly normal Sensory Exam -  grossly normal Reflexes: 1+ Coordination - grossly normal Gait - grossly normal Balance - grossly normal Cranial Nerves: I: smell Not tested  II: visual acuity  OS: nl    OD: nl  II: visual fields Full to confrontation  II: pupils Equal, round, reactive to light  III,VII: ptosis None  III,IV,VI: extraocular muscles  Full ROM  V: mastication Normal  V: facial light touch sensation  Normal  V,VII: corneal reflex  Present  VII: facial muscle function - upper  Normal  VII: facial muscle function - lower Normal  VIII: hearing Not tested  IX: soft palate elevation  Normal  IX,X: gag reflex Present  XI: trapezius strength  5/5  XI: sternocleidomastoid strength 5/5  XI: neck flexion strength  5/5  XII: tongue strength  Normal    Data Review Lab Results  Component Value Date   WBC 7.8 08/16/2016   HGB 13.5 08/16/2016   HCT 41.5 08/16/2016   MCV 92.4 08/16/2016   PLT 160 08/16/2016   Lab Results  Component Value Date   NA 138 08/16/2016   K 4.0 08/16/2016   CL 105 08/16/2016   CO2 23 08/16/2016   BUN 15 08/16/2016   CREATININE 0.91 08/16/2016   GLUCOSE 69 08/16/2016   Lab Results  Component Value Date   INR 1.17 08/16/2016    Assessment:   Cervical neck pain with herniated nucleus pulposus/ spondylosis/ stenosis at C3-4, C4-5. Patient has failed conservative therapy. Planned surgery : ACDF C3-4 C4-5  Plan:   I explained the condition and procedure to the patient and answered any questions.  Patient wishes to proceed with procedure as planned. Understands risks/ benefits/ and expected or typical outcomes.  Jazon Jipson S 08/18/2016 11:09 AM

## 2016-08-18 NOTE — Progress Notes (Signed)
Orthopedic Tech Progress Note Patient Details:  Sandra HickSarah A Stein Oct 11, 1960 [id number]  Ortho Devices Type of Ortho Device: Soft collar Ortho Device/Splint Interventions: Application   Saul FordyceJennifer C Zuly Belkin 08/18/2016, 6:33 PM

## 2016-08-18 NOTE — Op Note (Signed)
08/18/2016  1:35 PM  PATIENT:  Sandra Stein  56 y.o. 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 Decompressive anterior cervical discectomy C3-4 C4-5, 2. Anterior cervical arthrodesis C3-4 C4-5 utilizing peek interbody cages packed with morcellized allograft and morcellized autograft, 3. Anterior cervical plating C3 C5 utilizing an Atlantis venture plate, 4. Removal of anterior cervical plate [id number]  SURGEON:  Marikay Alaravid Aissa Lisowski, MD  ASSISTANTS: Dr. Lovell SheehanJenkins  ANESTHESIA:   General  EBL: 25 ml  Total I/O In: 1000 [I.V.:1000] Out: 25 [Blood:25]  BLOOD ADMINISTERED: none  DRAINS: None  SPECIMEN:  none  INDICATION FOR PROCEDURE: This patient presented with neck and shoulder pain. Imaging showed adjacent level spondylosis C3-4 and C4-5. The patient tried conservative measures without relief. Pain was debilitating. Recommended ACDF with plating C3-4 C4-5 with removal of C5-6 plate. Patient understood the risks, benefits, and alternatives and potential outcomes and wished to proceed.  PROCEDURE DETAILS: Patient was brought to the operating room placed under general endotracheal anesthesia. Patient was placed in the supine position on the operating room table. The neck was prepped with Duraprep and draped in a sterile fashion.   Three cc of local anesthesia was injected and a transverse incision was made on the right side of the neck.  Dissection was carried down thru the subcutaneous tissue and the platysma was  elevated, opened, and undermined with Metzenbaum scissors.  Dissection was then carried out thru an avascular plane leaving the sternocleidomastoid carotid artery and jugular vein laterally and the trachea and esophagus medially. The ventral aspect of the vertebral column was identified and a localizing x-ray was taken. The C3-4 level was identified. The longus colli muscles were  then elevated and the retractor was placed. The previous C5-6 plate was removed by removing the screws and then the plate was removed. The holes were dried with Surgifoam. A large anterior osteophyte overriding the C4-5 disc space was removed. The annulus was incised and the disc space entered at C3-4 and C4-5. Discectomy was performed with micro-curettes and pituitary rongeurs. I then used the high-speed drill to drill the endplates down to the level of the posterior longitudinal ligament. The drill shavings were saved in a mucous trap for later arthrodesis. The operating microscope was draped and brought into the field provided additional magnification, illumination and visualization. Discectomy was continued posteriorly thru the disc space. Posterior longitudinal ligament was opened with a nerve hook, and then removed along with disc herniation and osteophytes, decompressing the spinal canal and thecal sac. We then continued to remove osteophytic overgrowth and disc material decompressing the neural foramina and exiting nerve roots bilaterally. The scope was angled up and down to help decompress and undercut the vertebral bodies. Once the decompression was completed we could pass a nerve hook circumferentially to assure adequate decompression in the midline and in the neural foramina. So by both visualization and palpation we felt we had an adequate decompression of the neural elements. We then measured the height of the intravertebral disc space and selected a 8 millimeter Peek interbody cage packed with autograft and morcellized allograft. It was then gently positioned in the intravertebral disc space at both levels and countersunk. I then used a venture plate and placed  variable angle screws into the vertebral bodies of C3-C4 and C5 and locked them into position. The wound was irrigated with bacitracin solution, checked for hemostasis which was established and  confirmed. Once meticulous hemostasis was  achieved, we then proceeded with closure. The platysma was closed with interrupted 3-0 undyed Vicryl suture, the subcuticular layer was closed with interrupted 3-0 undyed Vicryl suture. The skin edges were approximated with steristrips. The drapes were removed. A sterile dressing was applied. The patient was then awakened from general anesthesia and transferred to the recovery room in stable condition. At the end of the procedure all sponge, needle and instrument counts were correct.    PLAN OF CARE: Admit for overnight observation  PATIENT DISPOSITION:  PACU - hemodynamically stable.   Delay start of Pharmacological VTE agent (>24hrs) due to surgical blood loss or risk of bleeding:  yes

## 2016-08-18 NOTE — Anesthesia Postprocedure Evaluation (Signed)
Anesthesia Post Note  Patient: Sandra Stein  Procedure(s) Performed: Procedure(s) (LRB): Left  Anterior Cervical Decompression Fusion - Cervical three-Cervical four - Cervival four--Cervical five, removal of Cervical five plate (Left)     Patient location during evaluation: PACU Anesthesia Type: General Level of consciousness: awake Pain management: pain level controlled Vital Signs Assessment: post-procedure vital signs reviewed and stable Respiratory status: spontaneous breathing Cardiovascular status: stable Anesthetic complications: no    Last Vitals:  Vitals:   08/18/16 1420 08/18/16 1425  BP: 140/74 (!) 147/76  Pulse: (!) 102 (!) 103  Resp: 16 11  Temp:  36.9 C    Last Pain:  Vitals:   08/18/16 1405  TempSrc:   PainSc: 9                  Laisha Rau

## 2016-08-18 NOTE — Transfer of Care (Signed)
Immediate Anesthesia Transfer of Care Note  Patient: Sandra HickSarah A Stein  Procedure(s) Performed: Procedure(s): Left  Anterior Cervical Decompression Fusion - Cervical three-Cervical four - Cervival four--Cervical five, removal of Cervical five plate (Left)  Patient Location: PACU  Anesthesia Type:General  Level of Consciousness: awake, alert , oriented and patient cooperative  Airway & Oxygen Therapy: Patient Spontanous Breathing and Patient connected to face mask oxygen  Post-op Assessment: Report given to RN, Post -op Vital signs reviewed and stable and Patient moving all extremities X 4  Post vital signs: Reviewed and stable  Last Vitals:  Vitals:   08/18/16 0842  BP: (!) 103/52  Pulse: 88  Resp: 20  Temp: 36.6 C    Last Pain:  Vitals:   08/18/16 0857  TempSrc:   PainSc: 7          Complications: No apparent anesthesia complications

## 2016-08-19 ENCOUNTER — Encounter (HOSPITAL_COMMUNITY): Payer: Self-pay | Admitting: Neurological Surgery

## 2016-08-19 DIAGNOSIS — M47812 Spondylosis without myelopathy or radiculopathy, cervical region: Secondary | ICD-10-CM | POA: Diagnosis not present

## 2016-08-19 LAB — GLUCOSE, CAPILLARY: GLUCOSE-CAPILLARY: 160 mg/dL — AB (ref 65–99)

## 2016-08-19 MED ORDER — OXYCODONE HCL 5 MG PO TABS: 5.0000 mg | ORAL_TABLET | Freq: Four times a day (QID) | ORAL | 0 refills | Status: AC | PRN

## 2016-08-19 NOTE — Progress Notes (Signed)
Patient alert and oriented, mae's well, voiding adequate amount of urine, swallowing without difficulty, c/o mild pain. Patient discharged home with family. Script and discharged instructions given to patient. Patient and family stated understanding of d/c instructions given and has an appointment with MD.   

## 2016-08-19 NOTE — Progress Notes (Signed)
Patient was brought back in to the department by volunteer d/t family not available to pick up patient after discharged. RN made patient comfortable in room and administered pain med and wait for family member for pick up

## 2016-08-19 NOTE — Discharge Summary (Signed)
Physician Discharge Summary  Patient ID: Sandra Stein MRN: [id number] DOB/AGE: Jun 30, 1960 56 y.o.  Admit date: 08/18/2016 Discharge date: 08/19/2016  Admission Diagnoses: cervical spondylosis   Discharge Diagnoses: same   Discharged Condition: good  Hospital Course: The patient was admitted on 08/18/2016 and taken to the operating room where the patient underwent ACDF. The patient tolerated the procedure well and was taken to the recovery room and then to the floor in stable condition. The hospital course was routine. There were no complications. The wound remained clean dry and intact. Pt had appropriate neck soreness. No complaints of arm pain or new N/T/W. The patient remained afebrile with stable vital signs, and tolerated a regular diet. The patient continued to increase activities, and pain was well controlled with oral pain medications.   Consults: None  Significant Diagnostic Studies:  Results for orders placed or performed during the hospital encounter of 08/18/16  Glucose, capillary  Result Value Ref Range   Glucose-Capillary 135 (H) 65 - 99 mg/dL   Comment 1 Notify RN    Comment 2 Document in Chart   Glucose, capillary  Result Value Ref Range   Glucose-Capillary 96 65 - 99 mg/dL  Glucose, capillary  Result Value Ref Range   Glucose-Capillary 150 (H) 65 - 99 mg/dL   Comment 1 Notify RN   Glucose, capillary  Result Value Ref Range   Glucose-Capillary 272 (H) 65 - 99 mg/dL   Comment 1 Document in Chart   Glucose, capillary  Result Value Ref Range   Glucose-Capillary 241 (H) 65 - 99 mg/dL   Comment 1 Notify RN    Comment 2 Document in Chart     Chest 2 View  Result Date: 08/16/2016 CLINICAL DATA:  Pre operative respiratory exam.  Cervicalgia. EXAM: CHEST  2 VIEW COMPARISON:  12/09/2015 and 09/17/2015 FINDINGS: The heart size and mediastinal contours are within normal limits. Both lungs are clear. The visualized skeletal structures are unremarkable. IMPRESSION: No  active cardiopulmonary disease. Electronically Signed   By: Francene Boyers M.D.   On: 08/16/2016 10:17   Dg Cervical Spine 2-3 Views  Result Date: 08/18/2016 CLINICAL DATA:  Cervical fusion . EXAM: DG C-ARM 61-120 MIN; CERVICAL SPINE - 2-3 VIEW COMPARISON:  MRI 06/06/2016. FINDINGS: Anterior cervical spine fusion noted. Difficult to number the vertebral bodies due to technique. Anterior fusion appears be present from C3 through C5. Interbody fusion devices also noted. Anatomic alignment. Hardware appears to be intact. Two images obtained. 0 minutes 4 seconds fluoroscopy time. IMPRESSION: Anterior cervical spine fusion. Electronically Signed   By: Maisie Fus  Register   On: 08/18/2016 13:32   Dg C-arm 1-60 Min  Result Date: 08/18/2016 CLINICAL DATA:  Cervical fusion . EXAM: DG C-ARM 61-120 MIN; CERVICAL SPINE - 2-3 VIEW COMPARISON:  MRI 06/06/2016. FINDINGS: Anterior cervical spine fusion noted. Difficult to number the vertebral bodies due to technique. Anterior fusion appears be present from C3 through C5. Interbody fusion devices also noted. Anatomic alignment. Hardware appears to be intact. Two images obtained. 0 minutes 4 seconds fluoroscopy time. IMPRESSION: Anterior cervical spine fusion. Electronically Signed   By: Maisie Fus  Register   On: 08/18/2016 13:32    Antibiotics:  Anti-infectives    Start     Dose/Rate Route Frequency Ordered Stop   08/18/16 1900  ceFAZolin (ANCEF) IVPB 2g/100 mL premix     2 g 200 mL/hr over 30 Minutes Intravenous Every 8 hours 08/18/16 1446 08/19/16 0339   08/18/16 1100  ceFAZolin (ANCEF) 3 g  in dextrose 5 % 50 mL IVPB  Status:  Discontinued     3 g 130 mL/hr over 30 Minutes Intravenous To [facility] 08/17/16 1412 08/18/16 1434      Discharge Exam: Blood pressure 132/71, pulse (!) 101, temperature 97.8 F (36.6 C), temperature source Oral, resp. rate 18, height 5\' 6"  (1.676 m), weight 122 kg (269 lb), SpO2 96 %. Neuro exam normal dressing dry  Discharge  Medications:   Allergies as of 08/19/2016      Reactions   Axert [almotriptan] Nausea And Vomiting, Other (See Comments)   Chest hurt   Red Dye Hives, Itching      Medication List    TAKE these medications   albuterol 108 (90 Base) MCG/ACT inhaler Commonly known as:  PROVENTIL HFA;VENTOLIN HFA Inhale 1-2 puffs into the lungs every 6 (six) hours as needed for wheezing or shortness of breath.   citalopram 40 MG tablet Commonly known as:  CELEXA Take 40 mg by mouth daily.   diazepam 10 MG tablet Commonly known as:  VALIUM Take 10 mg by mouth 3 (three) times daily as needed for anxiety.   furosemide 20 MG tablet Commonly known as:  LASIX Take 20 mg by mouth daily.   insulin aspart protamine- aspart (70-30) 100 UNIT/ML injection Commonly known as:  NOVOLOG MIX 70/30 Inject 40-45 Units into the skin 2 (two) times daily. Per BS   lisinopril 10 MG tablet Commonly known as:  PRINIVIL,ZESTRIL Take 10 mg by mouth daily.   metFORMIN 1000 MG tablet Commonly known as:  GLUCOPHAGE Take 1,000 mg by mouth 2 (two) times daily with a meal.   nortriptyline 25 MG capsule Commonly known as:  PAMELOR Take 25-50 mg by mouth See admin instructions. Take 25 mg by mouth in the morning and take 50 mg by mouth at bedtime   oxyCODONE 5 MG immediate release tablet Commonly known as:  Oxy IR/ROXICODONE Take 1-2 tablets (5-10 mg total) by mouth every 6 (six) hours as needed for moderate pain or breakthrough pain.   QUEtiapine 400 MG tablet Commonly known as:  SEROQUEL Take 400 mg by mouth at bedtime as needed (for sleep).       Disposition: home   Final Dx: ACDF C3-4, C4-5  Discharge Instructions     Remove dressing in 72 hours    Complete by:  As directed    Call MD for:  difficulty breathing, headache or visual disturbances    Complete by:  As directed    Call MD for:  persistant nausea and vomiting    Complete by:  As directed    Call MD for:  redness, tenderness, or signs of  infection (pain, swelling, redness, odor or green/yellow discharge around incision site)    Complete by:  As directed    Call MD for:  severe uncontrolled pain    Complete by:  As directed    Call MD for:  temperature >100.4    Complete by:  As directed    Diet - low sodium heart healthy    Complete by:  As directed    Increase activity slowly    Complete by:  As directed       Follow-up Information    Tia AlertJones, Daney Moor S, MD. Schedule an appointment as soon as possible for a visit in 2 week(s).   Specialty:  Neurosurgery Contact information: [address] [place] [address] [phone]  SignedTia Alert 08/19/2016, 7:26 AM

## 2019-01-26 IMAGING — CR DG CHEST 2V
2 series · 2 of 2 positions shown · non-contrast
Comparison: 12/09/2015 and 09/17/2015

CLINICAL DATA: Pre operative respiratory exam.  Cervicalgia.

EXAM:
CHEST  2 VIEW

[w chest pa]
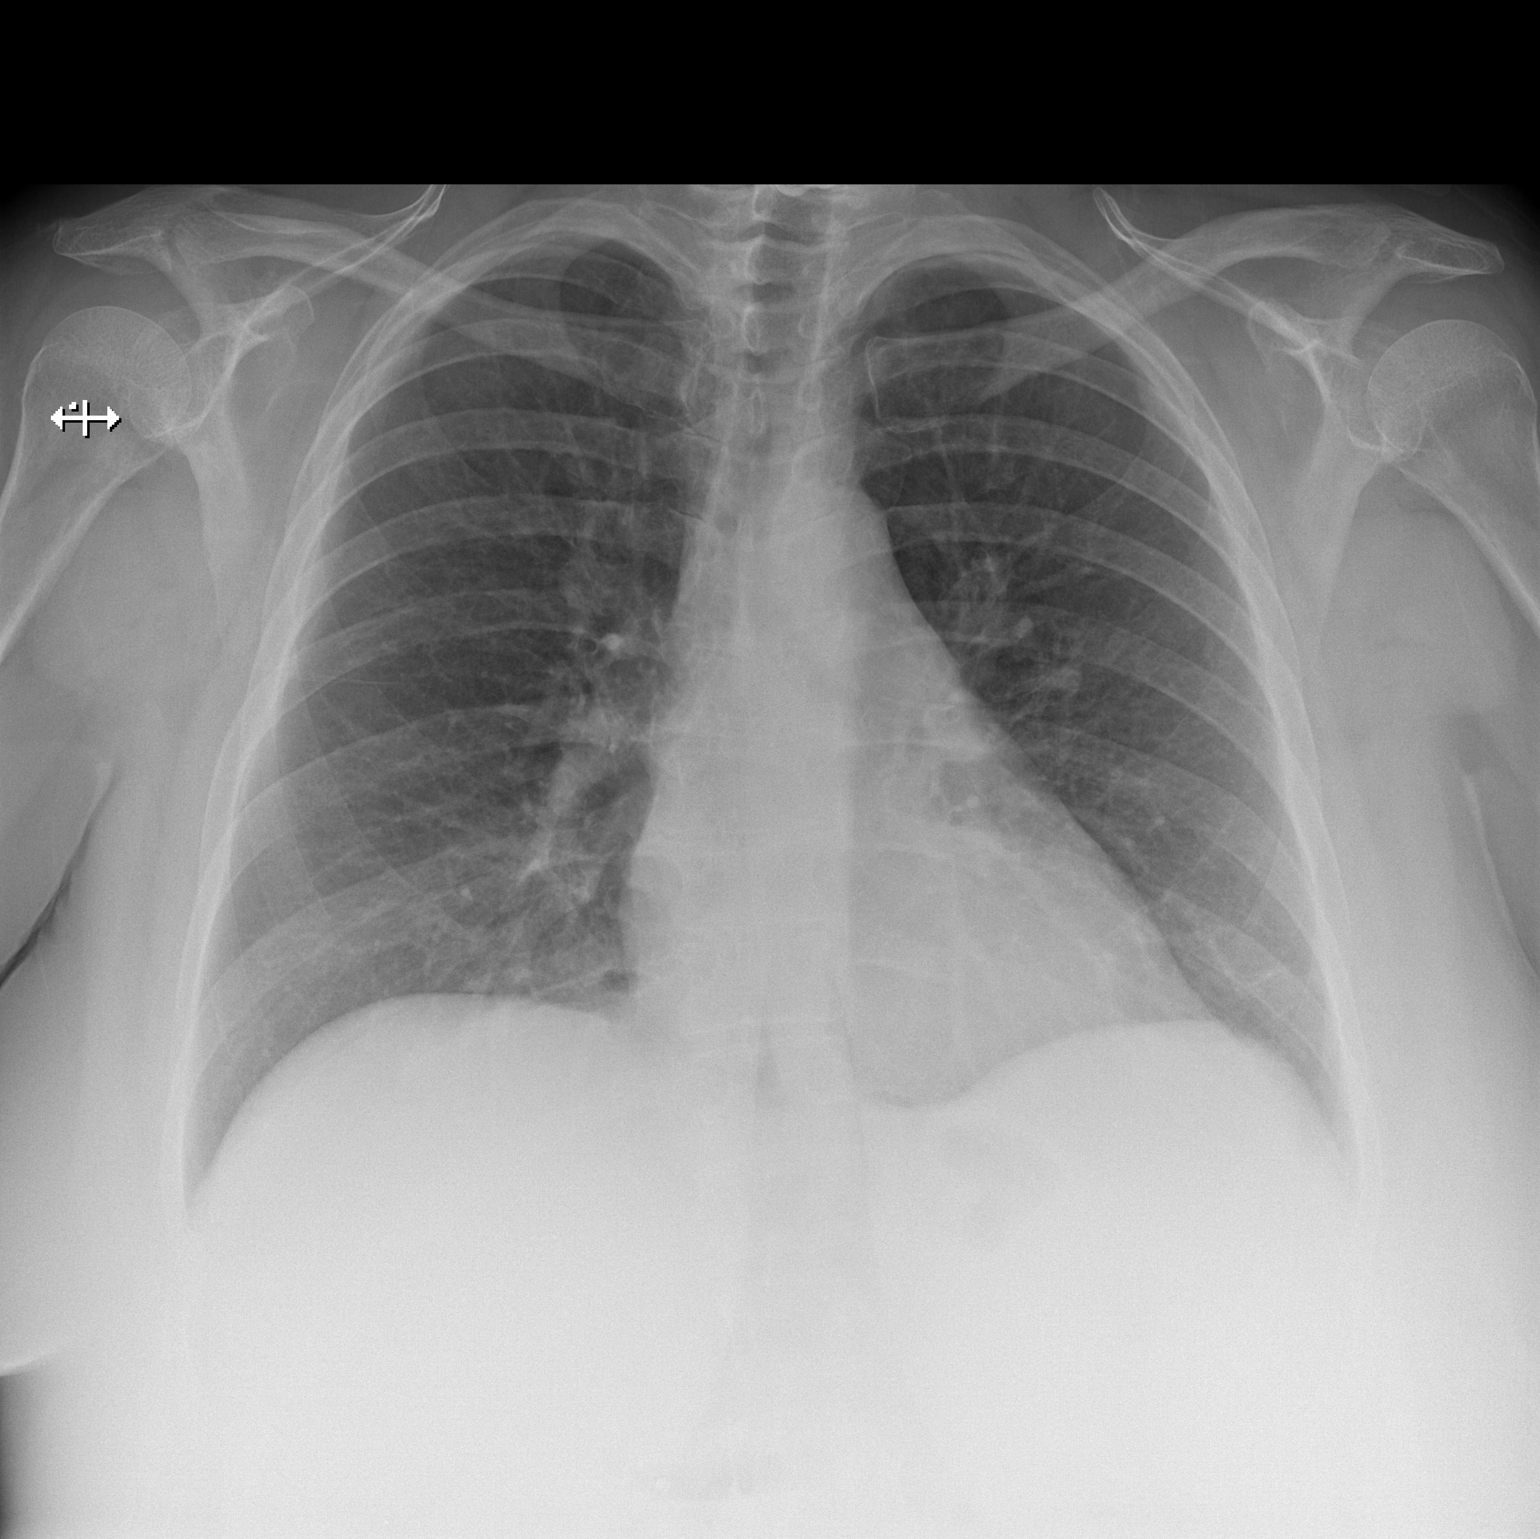

[w chest lat]
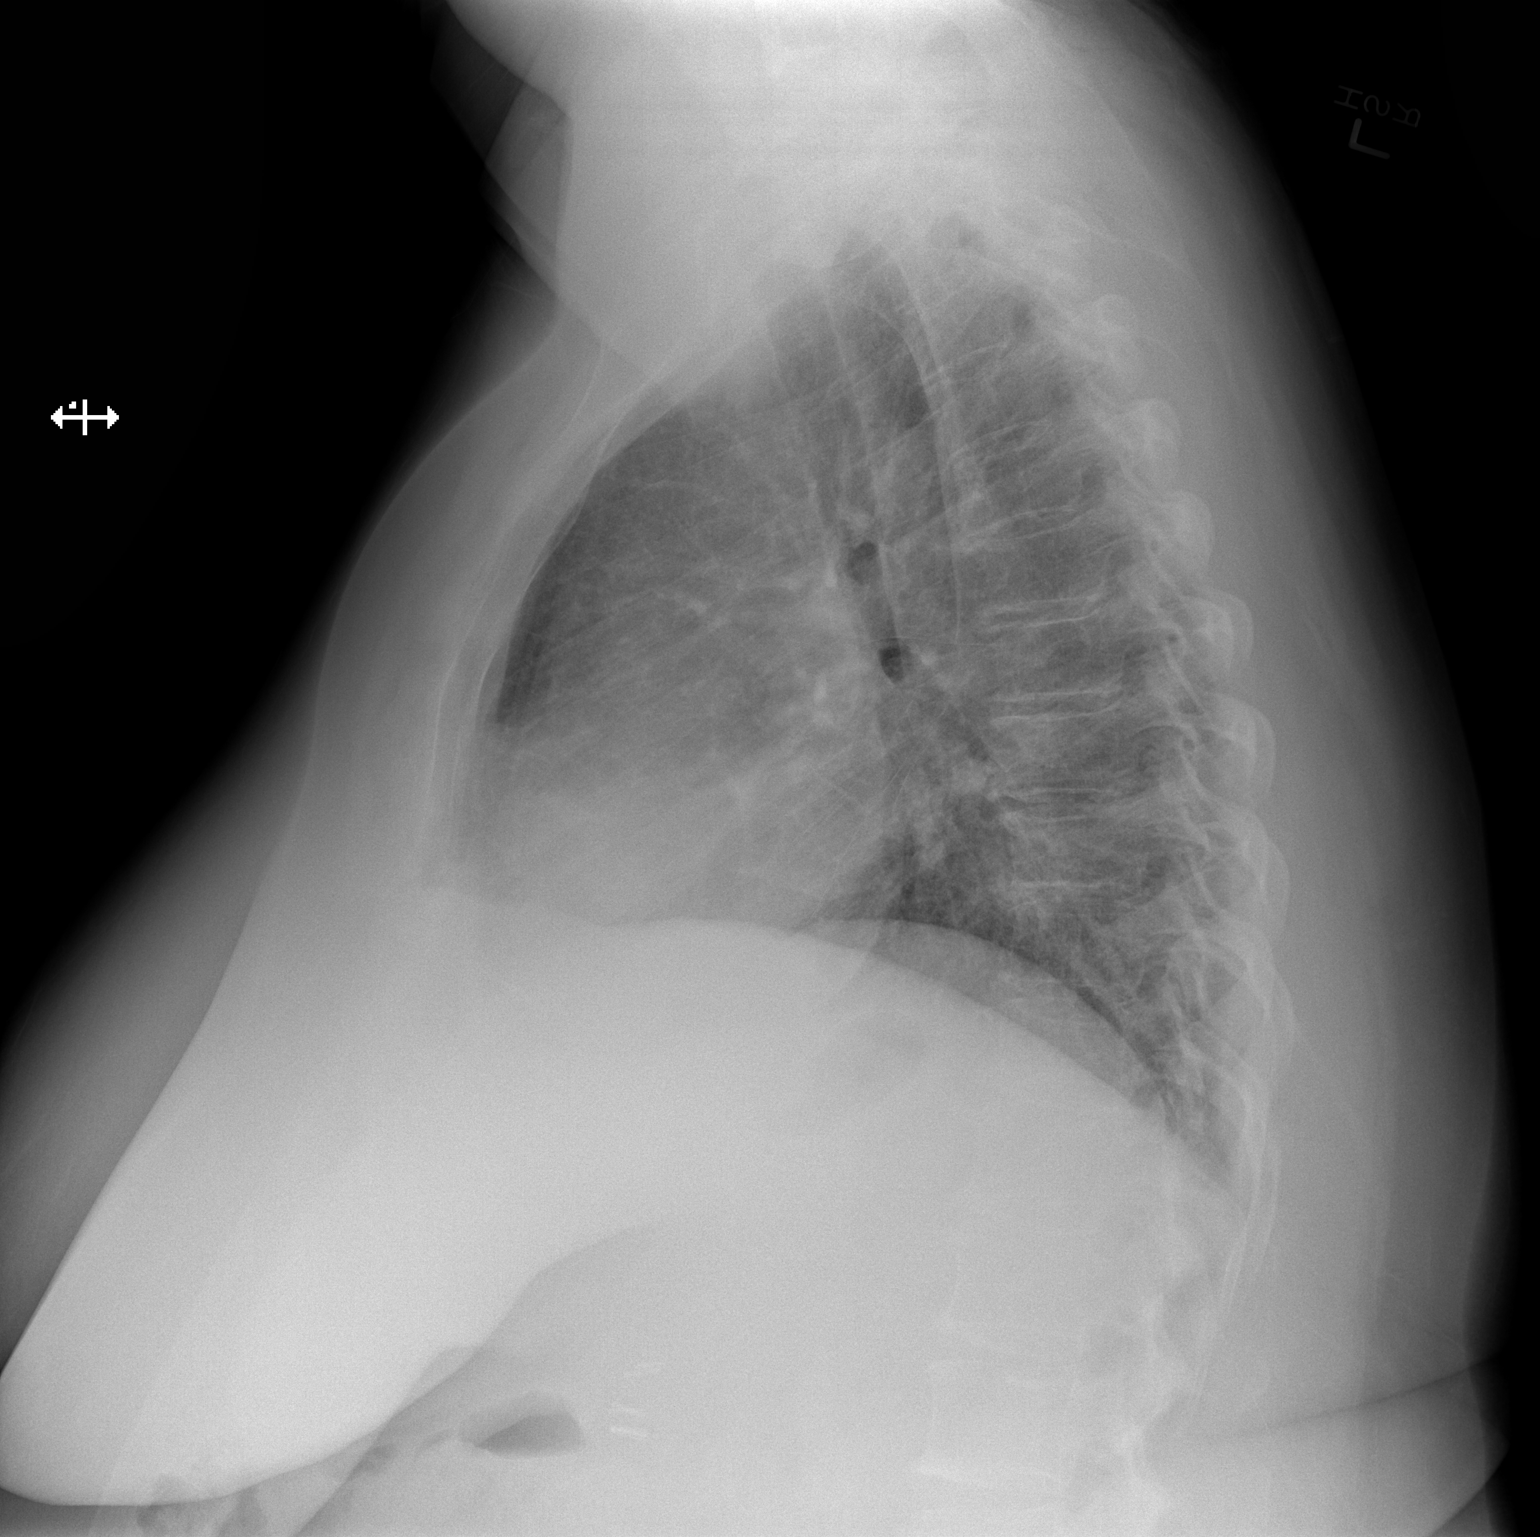

[2 of 2 positions shown; findings below may reference images not displayed]

FINDINGS: The heart size and mediastinal contours are within normal limits.
Both lungs are clear. The visualized skeletal structures are
unremarkable.
IMPRESSION: No active cardiopulmonary disease.

## 2021-08-12 DEATH — deceased
